# Patient Record
Sex: Female | Born: 1995 | Race: White | Hispanic: Yes | Marital: Single | Smoking: Never smoker
Health system: Southern US, Community
[De-identification: ages and names within clinical notes are randomized; demographics above are authoritative.]

## PROBLEM LIST (undated history)

## (undated) DIAGNOSIS — N6320 Unspecified lump in the left breast, unspecified quadrant: Secondary | ICD-10-CM

## (undated) HISTORY — PX: WISDOM TOOTH EXTRACTION: SHX21

---
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
## 2014-03-31 ENCOUNTER — Emergency Department (HOSPITAL_COMMUNITY): Payer: Medicaid Other

## 2014-03-31 ENCOUNTER — Emergency Department (HOSPITAL_COMMUNITY)
Admission: EM | Admit: 2014-03-31 | Discharge: 2014-03-31 | Disposition: A | Discharge status: Home / Self Care | Payer: Self-pay | Attending: Emergency Medicine | Admitting: Emergency Medicine

## 2014-03-31 ENCOUNTER — Encounter (HOSPITAL_COMMUNITY): Payer: Self-pay | Admitting: Emergency Medicine

## 2014-03-31 DIAGNOSIS — Z79899 Other long term (current) drug therapy: Secondary | ICD-10-CM | POA: Insufficient documentation

## 2014-03-31 DIAGNOSIS — Z792 Long term (current) use of antibiotics: Secondary | ICD-10-CM | POA: Insufficient documentation

## 2014-03-31 DIAGNOSIS — E860 Dehydration: Secondary | ICD-10-CM

## 2014-03-31 DIAGNOSIS — R109 Unspecified abdominal pain: Secondary | ICD-10-CM

## 2014-03-31 DIAGNOSIS — J45909 Unspecified asthma, uncomplicated: Secondary | ICD-10-CM | POA: Insufficient documentation

## 2014-03-31 DIAGNOSIS — Z3202 Encounter for pregnancy test, result negative: Secondary | ICD-10-CM | POA: Insufficient documentation

## 2014-03-31 DIAGNOSIS — M549 Dorsalgia, unspecified: Secondary | ICD-10-CM | POA: Insufficient documentation

## 2014-03-31 DIAGNOSIS — R112 Nausea with vomiting, unspecified: Secondary | ICD-10-CM

## 2014-03-31 LAB — CBC WITH DIFFERENTIAL/PLATELET
BASOS PCT: 0 % (ref 0–1)
Basophils Absolute: 0 10*3/uL (ref 0.0–0.1)
Eosinophils Absolute: 0 10*3/uL (ref 0.0–0.7)
Eosinophils Relative: 0 % (ref 0–5)
HEMATOCRIT: 36.1 % (ref 36.0–46.0)
HEMOGLOBIN: 11.8 g/dL — AB (ref 12.0–15.0)
LYMPHS ABS: 1.7 10*3/uL (ref 0.7–4.0)
Lymphocytes Relative: 19 % (ref 12–46)
MCH: 25.1 pg — AB (ref 26.0–34.0)
MCHC: 32.7 g/dL (ref 30.0–36.0)
MCV: 76.6 fL — ABNORMAL LOW (ref 78.0–100.0)
MONO ABS: 1.1 10*3/uL — AB (ref 0.1–1.0)
MONOS PCT: 13 % — AB (ref 3–12)
NEUTROS ABS: 6 10*3/uL (ref 1.7–7.7)
Neutrophils Relative %: 68 % (ref 43–77)
Platelets: 283 10*3/uL (ref 150–400)
RBC: 4.71 MIL/uL (ref 3.87–5.11)
RDW: 14.2 % (ref 11.5–15.5)
WBC: 8.9 10*3/uL (ref 4.0–10.5)

## 2014-03-31 LAB — COMPREHENSIVE METABOLIC PANEL
ALBUMIN: 3.9 g/dL (ref 3.5–5.2)
ALT: 7 U/L (ref 0–35)
ANION GAP: 13 (ref 5–15)
AST: 18 U/L (ref 0–37)
Alkaline Phosphatase: 48 U/L (ref 39–117)
BILIRUBIN TOTAL: 0.6 mg/dL (ref 0.3–1.2)
BUN: 12 mg/dL (ref 6–23)
CHLORIDE: 104 meq/L (ref 96–112)
CO2: 25 mEq/L (ref 19–32)
CREATININE: 1.36 mg/dL — AB (ref 0.50–1.10)
Calcium: 9.2 mg/dL (ref 8.4–10.5)
GFR, EST AFRICAN AMERICAN: 65 mL/min — AB (ref >90)
GFR, EST NON AFRICAN AMERICAN: 56 mL/min — AB (ref >90)
GLUCOSE: 90 mg/dL (ref 70–99)
Potassium: 3.5 mEq/L — ABNORMAL LOW (ref 3.7–5.3)
Sodium: 142 mEq/L (ref 137–147)
Total Protein: 7.2 g/dL (ref 6.0–8.3)

## 2014-03-31 LAB — LIPASE, BLOOD: LIPASE: 32 U/L (ref 11–59)

## 2014-03-31 LAB — URINALYSIS, ROUTINE W REFLEX MICROSCOPIC
BILIRUBIN URINE: NEGATIVE
GLUCOSE, UA: NEGATIVE mg/dL
KETONES UR: NEGATIVE mg/dL
Leukocytes, UA: NEGATIVE
Nitrite: NEGATIVE
PROTEIN: 100 mg/dL — AB
Specific Gravity, Urine: 1.008 (ref 1.005–1.030)
UROBILINOGEN UA: 0.2 mg/dL (ref 0.0–1.0)
pH: 6.5 (ref 5.0–8.0)

## 2014-03-31 LAB — POC URINE PREG, ED: Preg Test, Ur: NEGATIVE

## 2014-03-31 LAB — URINE MICROSCOPIC-ADD ON

## 2014-03-31 MED ORDER — ONDANSETRON HCL 4 MG/2ML IJ SOLN: 4.0000 mg | Freq: Once | INTRAMUSCULAR | Status: DC

## 2014-03-31 MED ORDER — HYDROCODONE-ACETAMINOPHEN 5-325 MG PO TABS: 2.0000 | ORAL_TABLET | ORAL | Status: DC | PRN

## 2014-03-31 MED ORDER — ONDANSETRON HCL 4 MG PO TABS: 4.0000 mg | ORAL_TABLET | Freq: Three times a day (TID) | ORAL | Status: DC | PRN

## 2014-03-31 MED ORDER — OXYCODONE-ACETAMINOPHEN 5-325 MG PO TABS: 1.0000 | ORAL_TABLET | Freq: Once | ORAL | Status: DC

## 2014-03-31 MED ORDER — HYDROCODONE-ACETAMINOPHEN 5-325 MG PO TABS
1.0000 | ORAL_TABLET | Freq: Once | ORAL | Status: AC
  Administered 2014-03-31: 1 via ORAL
  Filled 2014-03-31: qty 1

## 2014-03-31 MED ORDER — SODIUM CHLORIDE 0.9 % IV BOLUS (SEPSIS)
1000.0000 mL | Freq: Once | INTRAVENOUS | Status: AC
  Administered 2014-03-31: 1000 mL via INTRAVENOUS

## 2014-03-31 MED ORDER — ONDANSETRON HCL 4 MG/2ML IJ SOLN
4.0000 mg | Freq: Once | INTRAMUSCULAR | Status: AC
  Administered 2014-03-31: 4 mg via INTRAVENOUS
  Filled 2014-03-31: qty 2

## 2014-03-31 MED ORDER — MORPHINE SULFATE 4 MG/ML IJ SOLN
4.0000 mg | Freq: Once | INTRAMUSCULAR | Status: AC
  Administered 2014-03-31: 4 mg via INTRAVENOUS
  Filled 2014-03-31: qty 1

## 2014-03-31 NOTE — ED Notes (Signed)
Pt c/o back pain, lower abd pain and vomiting that started yesterday. Pt states constipation last BM was two days ago.

## 2014-03-31 NOTE — ED Provider Notes (Signed)
CSN: [id number]     Arrival date & time 03/31/14  1331 History   First MD Initiated Contact with Patient 03/31/14 1410     Chief Complaint  Patient presents with  . Back Pain  . Abdominal Pain    lower  . Emesis     (Consider location/radiation/quality/duration/timing/severity/associated sxs/prior Treatment) HPI  This is a 18 year old female with a history of asthma who presents with neck pain, abdominal pain, and vomiting. Onset of symptoms yesterday. Patient reports left-sided back and flank pain that radiates into her left lower quadrant. She states that it feels "sore." Currently her pain is 7 out of 10. She reports nonbilious, nonbloody emesis. She denies any diarrhea. Last normal bowel movement was 2 days ago. Patient denies any dysuria or hematuria. No known history of kidney stones. Denies fevers. Patient has had sick contacts.  Past Medical History  Diagnosis Date  . Asthma    History reviewed. No pertinent past surgical history. No family history on file. History  Substance Use Topics  . Smoking status: Never Smoker   . Smokeless tobacco: Not on file  . Alcohol Use: No   OB History    No data available     Review of Systems  Constitutional: Negative for fever.  Respiratory: Negative for chest tightness and shortness of breath.   Cardiovascular: Negative for chest pain.  Gastrointestinal: Positive for nausea, vomiting and abdominal pain. Negative for diarrhea.  Genitourinary: Positive for flank pain. Negative for dysuria and hematuria.  Musculoskeletal: Positive for back pain.  Neurological: Negative for headaches.  All other systems reviewed and are negative.     Allergies  Review of patient's allergies indicates no known allergies.  Home Medications   Prior to Admission medications   Medication Sig Start Date End Date Taking? Authorizing Provider  Multiple Vitamins-Minerals (MULTIVITAMIN & MINERAL PO) Take 1 tablet by mouth daily.   Yes Historical  Provider, MD  amoxicillin (AMOXIL) 500 MG capsule Take 500 mg by mouth 3 (three) times daily. Started on 12/23/13 for ten days    Historical Provider, MD  azithromycin (ZITHROMAX Z-PAK) 250 MG tablet 2 po day one, then 1 daily x 4 days 12/24/13   Carman Ching, PA-C  etonogestrel (NEXPLANON) 68 MG IMPL implant 1 each by Subdermal route once.    Historical Provider, MD  HYDROcodone-acetaminophen (NORCO/VICODIN) 5-325 MG per tablet Take 2 tablets by mouth every 4 (four) hours as needed for moderate pain or severe pain. 03/31/14   Merryl Hacker, MD  ibuprofen (ADVIL,MOTRIN) 200 MG tablet Take 600 mg by mouth every 6 (six) hours as needed for moderate pain.    Historical Provider, MD  ondansetron (ZOFRAN ODT) 4 MG disintegrating tablet 4mg  ODT q4 hours prn nausea/vomit 12/24/13   Robyn M Hess, PA-C  ondansetron (ZOFRAN) 4 MG tablet Take 1 tablet (4 mg total) by mouth every 8 (eight) hours as needed for nausea or vomiting. 03/31/14   Merryl Hacker, MD   BP 107/74 mmHg  Pulse 65  Temp(Src) 97.8 F (36.6 C) (Oral)  Resp 16  SpO2 100%  LMP 03/13/2014 Physical Exam  Constitutional: She is oriented to person, place, and time. She appears well-developed and well-nourished. No distress.  HENT:  Head: Normocephalic and atraumatic.  Cardiovascular: Normal rate, regular rhythm and normal heart sounds.   No murmur heard. Pulmonary/Chest: Effort normal and breath sounds normal. No respiratory distress. She has no wheezes.  Abdominal: Soft. Bowel sounds are normal. There is no tenderness. There  is no rebound and no guarding.  Musculoskeletal: She exhibits no edema.  Neurological: She is alert and oriented to person, place, and time.  Skin: Skin is warm and dry.  Psychiatric: She has a normal mood and affect.  Nursing note and vitals reviewed.   ED Course  Procedures (including critical care time) Labs Review Labs Reviewed  CBC WITH DIFFERENTIAL - Abnormal; Notable for the following:    Hemoglobin  11.8 (*)    MCV 76.6 (*)    MCH 25.1 (*)    Monocytes Relative 13 (*)    Monocytes Absolute 1.1 (*)    All other components within normal limits  COMPREHENSIVE METABOLIC PANEL - Abnormal; Notable for the following:    Potassium 3.5 (*)    Creatinine, Ser 1.36 (*)    GFR calc non Af Amer 56 (*)    GFR calc Af Amer 65 (*)    All other components within normal limits  URINALYSIS, ROUTINE W REFLEX MICROSCOPIC - Abnormal; Notable for the following:    Hgb urine dipstick TRACE (*)    Protein, ur 100 (*)    All other components within normal limits  LIPASE, BLOOD  URINE MICROSCOPIC-ADD ON  POC URINE PREG, ED    Imaging Review Ct Renal Stone Study  03/31/2014   CLINICAL DATA:  Bilateral flank pain began yesterday. Left side is worse than right.  EXAM: CT ABDOMEN AND PELVIS WITHOUT CONTRAST  TECHNIQUE: Multidetector CT imaging of the abdomen and pelvis was performed following the standard protocol without IV contrast.  COMPARISON:  None.  FINDINGS: Lower chest: The lung bases are clear of acute process. No pleural effusion or pulmonary lesions. The heart is normal in size. No pericardial effusion. The distal esophagus and aorta are unremarkable.  Hepatobiliary: Normal.  Pancreas: Normal.  Spleen: Normal.  Adrenals/Urinary Tract: The adrenal glands are normal. Both kidneys are unremarkable. No renal or obstructing ureteral calculi or bladder calculi.  Stomach/Bowel: The stomach, duodenum, small bowel and colon are grossly normal without oral contrast. No obvious inflammatory changes, mass lesions or obstructive findings. The terminal ileum appears normal. The appendix is normal.  Vascular/Lymphatic: No mesenteric or retroperitoneal mass or adenopathy. The aorta is normal in caliber.  Reproductive: The uterus and ovaries are unremarkable. Mild retroverted uterus. The bladder is normal. No pelvic mass, adenopathy or free pelvic fluid collections. No inguinal mass or adenopathy.  Other: No free abdominal/  pelvic fluid collections or free air. No abdominal wall hernia.  Musculoskeletal: No significant findings.  IMPRESSION: Unremarkable noncontrast CT examination of the abdomen/pelvis. No renal or obstructing ureteral calculi.   Electronically Signed   By: Kalman Jewels M.D.   On: 03/31/2014 15:51     EKG Interpretation None      MDM   Final diagnoses:  Left flank pain  Non-intractable vomiting with nausea, vomiting of unspecified type  Dehydration   Left-sided back and flank pain and vomiting. She is nontoxic on exam. Vital signs are stable. No significant abdominal tenderness on exam. Basic labwork obtained and patient given medications. Lab work notable for creatinine of 1.36. Likely reflective of dehydration. Patient's presentation could be consistent with kidney stones. CT noncontrast is obtained and shows no evidence of renal calculi. And is otherwise unremarkable.  Patient does report sick contacts. While she has no diarrhea, vomiting could be secondary to early GI illness.  Discussed with patient and her father supportive care at home. She has worsening of symptoms, she should be reevaluated.  After  history, exam, and medical workup I feel the patient has been appropriately medically screened and is safe for discharge home. Pertinent diagnoses were discussed with the patient. Patient was given return precautions.      Merryl Hacker, MD 04/01/14 [phone]

## 2014-03-31 NOTE — Discharge Instructions (Signed)
You were seen today for back pain, abdominal pain and vomiting. Workup was largely reassuring. You do appear somewhat dehydrated. He will be given nausea medication. Your symptoms may be related to early viral gastroenteritis.  If you have any new or worsening symptoms, you should be reevaluated.  Flank Pain Flank pain refers to pain that is located on the side of the body between the upper abdomen and the back. The pain may occur over a short period of time (acute) or may be long-term or reoccurring (chronic). It may be mild or severe. Flank pain can be caused by many things. CAUSES  Some of the more common causes of flank pain include:  Muscle strains.   Muscle spasms.   A disease of your spine (vertebral disk disease).   A lung infection (pneumonia).   Fluid around your lungs (pulmonary edema).   A kidney infection.   Kidney stones.   A very painful skin rash caused by the chickenpox virus (shingles).   Gallbladder disease.  [place] care will depend on the cause of your pain. In general,  Rest as directed by your caregiver.  Drink enough fluids to keep your urine clear or pale yellow.  Only take over-the-counter or prescription medicines as directed by your caregiver. Some medicines may help relieve the pain.  Tell your caregiver about any changes in your pain.  Follow up with your caregiver as directed. SEEK IMMEDIATE MEDICAL CARE IF:   Your pain is not controlled with medicine.   You have new or worsening symptoms.  Your pain increases.   You have abdominal pain.   You have shortness of breath.   You have persistent nausea or vomiting.   You have swelling in your abdomen.   You feel faint or pass out.   You have blood in your urine.  You have a fever or persistent symptoms for more than 2-3 days.  You have a fever and your symptoms suddenly get worse. MAKE SURE YOU:   Understand these instructions.  Will watch  your condition.  Will get help right away if you are not doing well or get worse. Document Released: 07/07/2005 Document Revised: 02/08/2012 Document Reviewed: 12/29/2011 [facility] Patient Information 2015 [place], [place]. This information is not intended to replace advice given to you by your health care provider. Make sure you discuss any questions you have with your health care provider.

## 2014-04-02 ENCOUNTER — Emergency Department (HOSPITAL_COMMUNITY): Payer: Medicaid Other

## 2014-04-02 ENCOUNTER — Emergency Department (HOSPITAL_COMMUNITY)
Admission: EM | Admit: 2014-04-02 | Discharge: 2014-04-02 | Disposition: A | Discharge status: Home / Self Care | Payer: Medicaid Other | Attending: Emergency Medicine | Admitting: Emergency Medicine

## 2014-04-02 ENCOUNTER — Encounter (HOSPITAL_COMMUNITY): Payer: Self-pay

## 2014-04-02 DIAGNOSIS — R112 Nausea with vomiting, unspecified: Secondary | ICD-10-CM | POA: Insufficient documentation

## 2014-04-02 DIAGNOSIS — Z3202 Encounter for pregnancy test, result negative: Secondary | ICD-10-CM | POA: Insufficient documentation

## 2014-04-02 DIAGNOSIS — J45909 Unspecified asthma, uncomplicated: Secondary | ICD-10-CM | POA: Insufficient documentation

## 2014-04-02 DIAGNOSIS — M549 Dorsalgia, unspecified: Secondary | ICD-10-CM | POA: Insufficient documentation

## 2014-04-02 DIAGNOSIS — Z79899 Other long term (current) drug therapy: Secondary | ICD-10-CM | POA: Insufficient documentation

## 2014-04-02 DIAGNOSIS — Z792 Long term (current) use of antibiotics: Secondary | ICD-10-CM | POA: Insufficient documentation

## 2014-04-02 DIAGNOSIS — K5900 Constipation, unspecified: Secondary | ICD-10-CM | POA: Insufficient documentation

## 2014-04-02 DIAGNOSIS — R110 Nausea: Secondary | ICD-10-CM

## 2014-04-02 DIAGNOSIS — R109 Unspecified abdominal pain: Secondary | ICD-10-CM | POA: Insufficient documentation

## 2014-04-02 LAB — CBC WITH DIFFERENTIAL/PLATELET
BASOS PCT: 0 % (ref 0–1)
Basophils Absolute: 0 10*3/uL (ref 0.0–0.1)
EOS ABS: 0.1 10*3/uL (ref 0.0–0.7)
Eosinophils Relative: 1 % (ref 0–5)
HCT: 35.4 % — ABNORMAL LOW (ref 36.0–46.0)
HEMOGLOBIN: 11.4 g/dL — AB (ref 12.0–15.0)
Lymphocytes Relative: 16 % (ref 12–46)
Lymphs Abs: 1.5 10*3/uL (ref 0.7–4.0)
MCH: 24.8 pg — AB (ref 26.0–34.0)
MCHC: 32.2 g/dL (ref 30.0–36.0)
MCV: 77 fL — AB (ref 78.0–100.0)
MONOS PCT: 11 % (ref 3–12)
Monocytes Absolute: 0.9 10*3/uL (ref 0.1–1.0)
Neutro Abs: 6.4 10*3/uL (ref 1.7–7.7)
Neutrophils Relative %: 72 % (ref 43–77)
Platelets: 280 10*3/uL (ref 150–400)
RBC: 4.6 MIL/uL (ref 3.87–5.11)
RDW: 14.2 % (ref 11.5–15.5)
WBC: 8.9 10*3/uL (ref 4.0–10.5)

## 2014-04-02 LAB — URINALYSIS, ROUTINE W REFLEX MICROSCOPIC
BILIRUBIN URINE: NEGATIVE
Glucose, UA: NEGATIVE mg/dL
Ketones, ur: NEGATIVE mg/dL
Leukocytes, UA: NEGATIVE
NITRITE: NEGATIVE
PROTEIN: NEGATIVE mg/dL
SPECIFIC GRAVITY, URINE: 1.005 (ref 1.005–1.030)
Urobilinogen, UA: 0.2 mg/dL (ref 0.0–1.0)
pH: 6 (ref 5.0–8.0)

## 2014-04-02 LAB — COMPREHENSIVE METABOLIC PANEL
ALBUMIN: 3.7 g/dL (ref 3.5–5.2)
ALT: 6 U/L (ref 0–35)
ANION GAP: 12 (ref 5–15)
AST: 10 U/L (ref 0–37)
Alkaline Phosphatase: 44 U/L (ref 39–117)
BILIRUBIN TOTAL: 0.4 mg/dL (ref 0.3–1.2)
BUN: 14 mg/dL (ref 6–23)
CHLORIDE: 102 meq/L (ref 96–112)
CO2: 25 mEq/L (ref 19–32)
Calcium: 9.2 mg/dL (ref 8.4–10.5)
Creatinine, Ser: 2.76 mg/dL — ABNORMAL HIGH (ref 0.50–1.10)
GFR calc Af Amer: 28 mL/min — ABNORMAL LOW (ref >90)
GFR calc non Af Amer: 24 mL/min — ABNORMAL LOW (ref >90)
Glucose, Bld: 93 mg/dL (ref 70–99)
Potassium: 3.5 mEq/L — ABNORMAL LOW (ref 3.7–5.3)
Sodium: 139 mEq/L (ref 137–147)
TOTAL PROTEIN: 7.1 g/dL (ref 6.0–8.3)

## 2014-04-02 LAB — PREGNANCY, URINE: Preg Test, Ur: NEGATIVE

## 2014-04-02 LAB — LIPASE, BLOOD: LIPASE: 23 U/L (ref 11–59)

## 2014-04-02 LAB — URINE MICROSCOPIC-ADD ON

## 2014-04-02 MED ORDER — POLYETHYLENE GLYCOL 3350 17 GM/SCOOP PO POWD: 17.0000 g | Freq: Two times a day (BID) | ORAL | Status: DC

## 2014-04-02 MED ORDER — ONDANSETRON 4 MG PO TBDP: 4.0000 mg | ORAL_TABLET | Freq: Three times a day (TID) | ORAL | Status: DC | PRN

## 2014-04-02 MED ORDER — SODIUM CHLORIDE 0.9 % IV BOLUS (SEPSIS)
1000.0000 mL | Freq: Once | INTRAVENOUS | Status: AC
  Administered 2014-04-02: 1000 mL via INTRAVENOUS

## 2014-04-02 MED ORDER — ONDANSETRON HCL 4 MG/2ML IJ SOLN
4.0000 mg | Freq: Once | INTRAMUSCULAR | Status: AC
  Administered 2014-04-02: 4 mg via INTRAVENOUS
  Filled 2014-04-02: qty 2

## 2014-04-02 NOTE — ED Notes (Signed)
Pt with abdominal pain/emesis and back pain since Sunday.  No dx.  Treated with pain meds and sent home.  Pt is constipated.  Emesis continues.  Last bm Friday

## 2014-04-02 NOTE — Discharge Instructions (Signed)
Take zofran as needed for nausea. Take miralax twice per day for constipation. Refer to attached documents for more information. Return to the ED with worsening or concerning symptoms.

## 2014-04-02 NOTE — ED Provider Notes (Signed)
CSN: [id number]     Arrival date & time 04/02/14  1541 History   First MD Initiated Contact with Patient 04/02/14 1636     Chief Complaint  Patient presents with  . Abdominal Pain  . Emesis     (Consider location/radiation/quality/duration/timing/severity/associated sxs/prior Treatment) HPI Comments: Patient is a 18 year old female with a past medical history of asthma who presents with bilateral flank pain that started 4 days ago. The pain is located in her bilateral flanks and radiate around to her lower abdomen. The pain is described as aching and severe. The pain started gradually and progressively worsened since the onset. No alleviating/aggravating factors. The patient has tried zofran and vicodin for symptoms without relief. Patient was seen 2 days ago in the ED for the same symptoms. Patient's work up unremarkable. Associated symptoms include nausea and vomiting and constipation. Patient denies fever, headache, diarrhea, chest pain, SOB, dysuria, abnormal vaginal bleeding/discharge. Patient reports no bowel movement in 5 days.     Past Medical History  Diagnosis Date  . Asthma    History reviewed. No pertinent past surgical history. History reviewed. No pertinent family history. History  Substance Use Topics  . Smoking status: Never Smoker   . Smokeless tobacco: Not on file  . Alcohol Use: No   OB History    No data available     Review of Systems  Constitutional: Negative for fever, chills and fatigue.  HENT: Negative for trouble swallowing.   Eyes: Negative for visual disturbance.  Respiratory: Negative for shortness of breath.   Cardiovascular: Negative for chest pain and palpitations.  Gastrointestinal: Positive for nausea and vomiting. Negative for abdominal pain and diarrhea.  Genitourinary: Positive for flank pain. Negative for dysuria and difficulty urinating.  Musculoskeletal: Positive for back pain. Negative for arthralgias and neck pain.  Skin: Negative for  color change.  Neurological: Negative for dizziness and weakness.  Psychiatric/Behavioral: Negative for dysphoric mood.      Allergies  Shrimp  Home Medications   Prior to Admission medications   Medication Sig Start Date End Date Taking? Authorizing Provider  etonogestrel (NEXPLANON) 68 MG IMPL implant 1 each by Subdermal route once.   Yes Historical Provider, MD  HYDROcodone-acetaminophen (NORCO/VICODIN) 5-325 MG per tablet Take 2 tablets by mouth every 4 (four) hours as needed for moderate pain or severe pain. 03/31/14  Yes Merryl Hacker, MD  ibuprofen (ADVIL,MOTRIN) 200 MG tablet Take 600 mg by mouth every 6 (six) hours as needed for moderate pain.   Yes Historical Provider, MD  Multiple Vitamins-Minerals (MULTIVITAMIN & MINERAL PO) Take 1 tablet by mouth daily.   Yes Historical Provider, MD  ondansetron (ZOFRAN ODT) 4 MG disintegrating tablet 4mg  ODT q4 hours prn nausea/vomit 12/24/13  Yes Robyn M Hess, PA-C  amoxicillin (AMOXIL) 500 MG capsule Take 500 mg by mouth 3 (three) times daily. Started on 12/23/13 for ten days    Historical Provider, MD  azithromycin (ZITHROMAX Z-PAK) 250 MG tablet 2 po day one, then 1 daily x 4 days 12/24/13   Hessie Diener Hess, PA-C  ondansetron (ZOFRAN) 4 MG tablet Take 1 tablet (4 mg total) by mouth every 8 (eight) hours as needed for nausea or vomiting. Patient not taking: Reported on 04/02/2014 03/31/14   Merryl Hacker, MD   BP 138/86 mmHg  Pulse 67  Temp(Src) 98 F (36.7 C) (Oral)  Resp 14  SpO2 100%  LMP 03/13/2014 Physical Exam  Constitutional: She is oriented to person, place, and time.  She appears well-developed and well-nourished. No distress.  HENT:  Head: Normocephalic and atraumatic.  Eyes: Conjunctivae and EOM are normal.  Neck: Normal range of motion.  Cardiovascular: Normal rate and regular rhythm.  Exam reveals no gallop and no friction rub.   No murmur heard. Pulmonary/Chest: Effort normal and breath sounds normal. She has no  wheezes. She has no rales. She exhibits no tenderness.  Abdominal: Soft. She exhibits no distension. There is no tenderness. There is no rebound.  Genitourinary:  No CVA tenderness.   Musculoskeletal: Normal range of motion.  Neurological: She is alert and oriented to person, place, and time. Coordination normal.  Speech is goal-oriented. Moves limbs without ataxia.   Skin: Skin is warm and dry.  Psychiatric: She has a normal mood and affect. Her behavior is normal.  Nursing note and vitals reviewed.   ED Course  Procedures (including critical care time) Labs Review Labs Reviewed  CBC WITH DIFFERENTIAL - Abnormal; Notable for the following:    Hemoglobin 11.4 (*)    HCT 35.4 (*)    MCV 77.0 (*)    MCH 24.8 (*)    All other components within normal limits  COMPREHENSIVE METABOLIC PANEL - Abnormal; Notable for the following:    Potassium 3.5 (*)    Creatinine, Ser 2.76 (*)    GFR calc non Af Amer 24 (*)    GFR calc Af Amer 28 (*)    All other components within normal limits  URINALYSIS, ROUTINE W REFLEX MICROSCOPIC - Abnormal; Notable for the following:    Hgb urine dipstick TRACE (*)    All other components within normal limits  LIPASE, BLOOD  PREGNANCY, URINE  URINE MICROSCOPIC-ADD ON    Imaging Review Dg Abd Acute W/chest  04/02/2014   CLINICAL DATA:  Abdominal pain and vomiting for 3 days. The patient was seen in the emergency department 2 days ago. No improvement.  EXAM: ACUTE ABDOMEN SERIES (ABDOMEN 2 VIEW & CHEST 1 VIEW)  COMPARISON:  CT of the abdomen and pelvis on 03/31/2014  FINDINGS: Heart size is normal. Lungs are clear. There is no free intraperitoneal air. Bowel gas pattern is nonobstructed. No evidence for organomegaly. Visualized osseous structures have a normal appearance.  IMPRESSION: Negative abdominal radiographs.  No acute cardiopulmonary disease.   Electronically Signed   By: Shon Hale M.D.   On: 04/02/2014 18:06     EKG Interpretation None      MDM    Final diagnoses:  Abdominal pain  Flank pain  Nausea    5:04 PM Labs and urinalysis pending. Patient will have acute abdominal series for constipation.   Patient's labs and urinalysis unremarkable for acute changes. Patient will be discharged with zofran for nausea and miralax for constipation. No further evaluation needed at this time.    Alvina Chou, PA-C 04/03/14 0038  Evelina Bucy, MD 04/05/14 2325

## 2015-04-30 IMAGING — CT CT RENAL STONE PROTOCOL
1 series · 13 of 15 positions shown, 19 images · non-contrast
Comparison: None.

CLINICAL DATA: Bilateral flank pain began yesterday. Left side is
worse than right.

EXAM:
CT ABDOMEN AND PELVIS WITHOUT CONTRAST
TECHNIQUE: Multidetector CT imaging of the abdomen and pelvis was performed
following the standard protocol without IV contrast.

[Series 4: lung · axial · 0.61mm/px · z∈[-71,-11]mm · 13 of 15 slices shown, 19 images]
[im 2/15  soft-tissue]
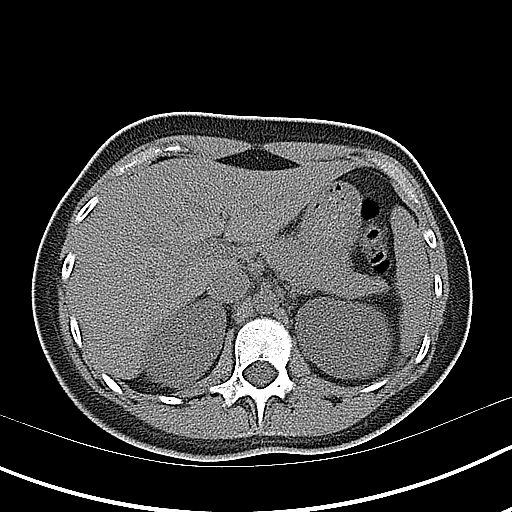
[im 2/15  bone]
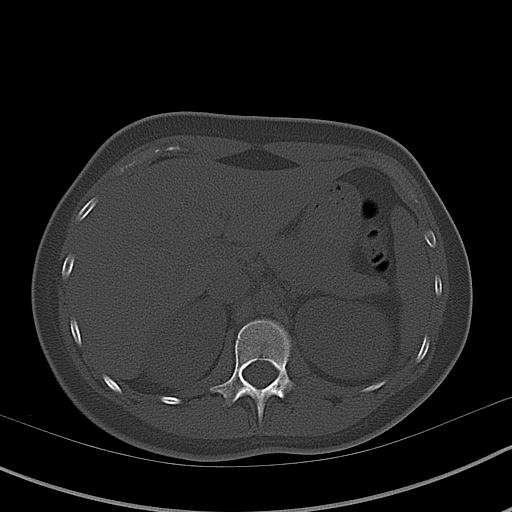
[im 3/15  soft-tissue]
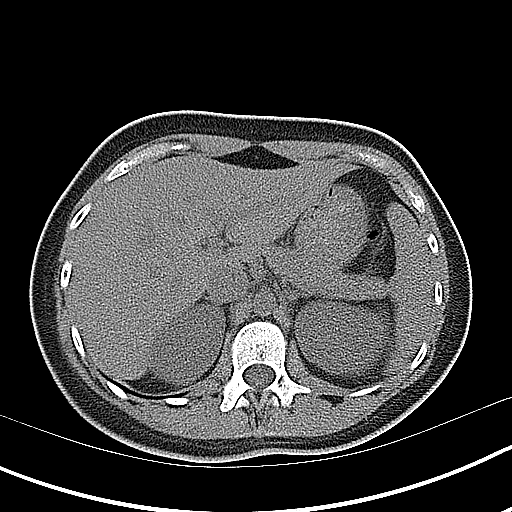
[im 4/15  soft-tissue]
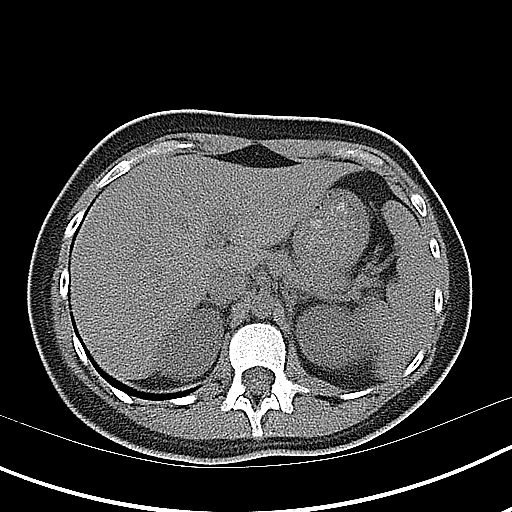
[im 5/15  soft-tissue]
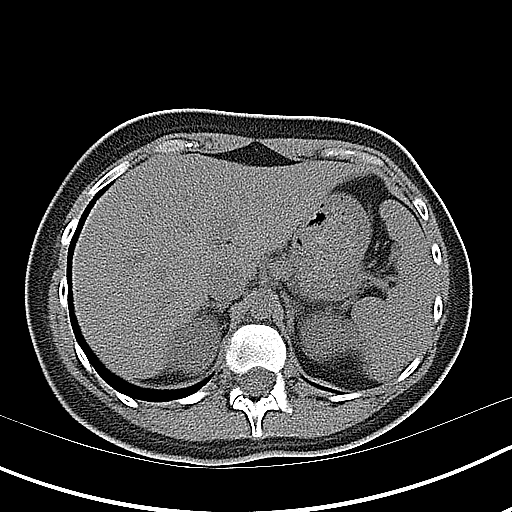
[im 6/15  soft-tissue]
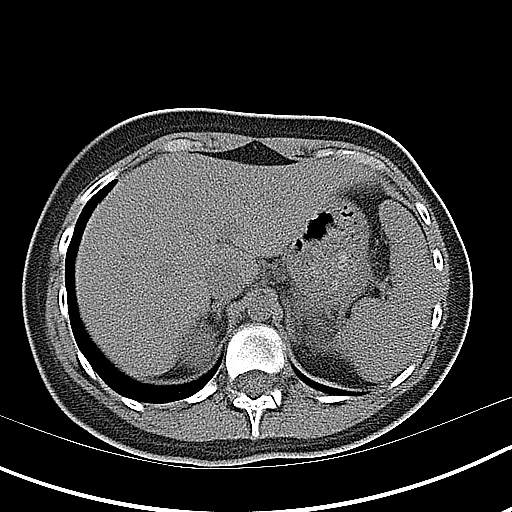
[im 7/15  soft-tissue]
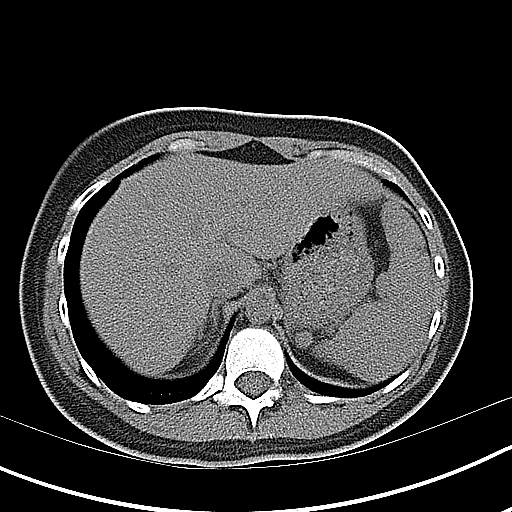
[im 8/15  soft-tissue]
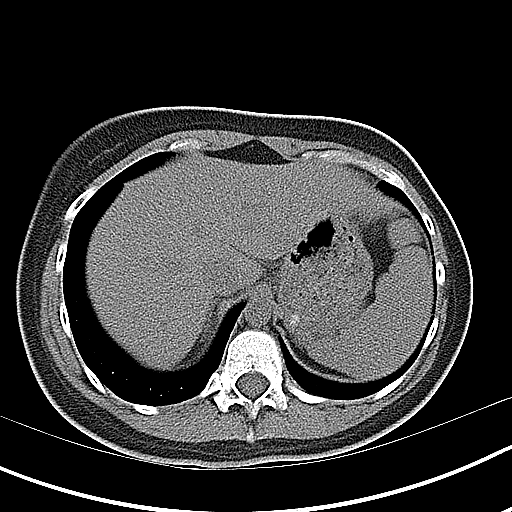
[im 9/15  soft-tissue]
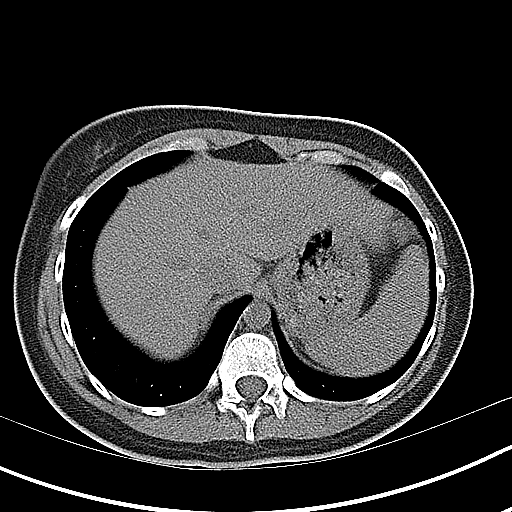
[im 10/15  soft-tissue]
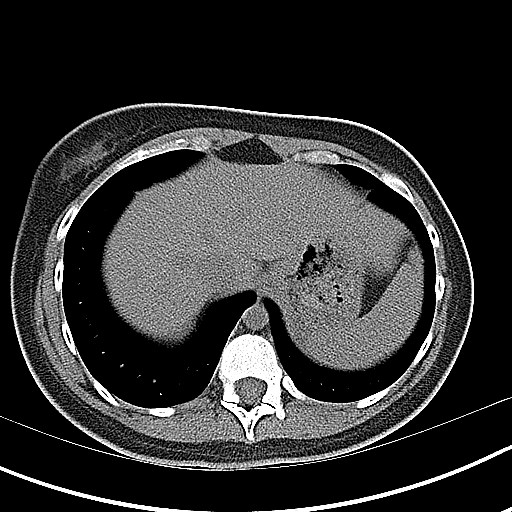
[im 10/15  bone]
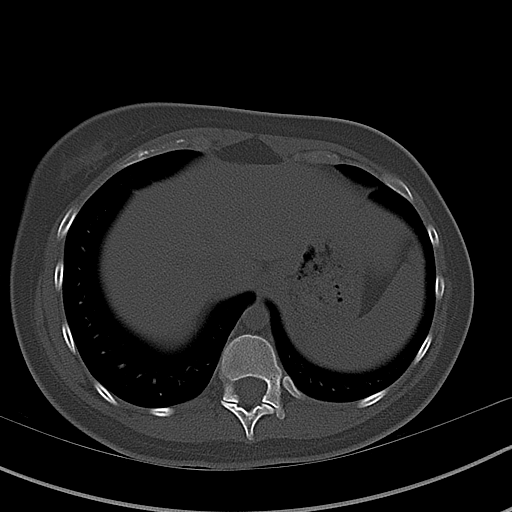
[im 11/15  soft-tissue]
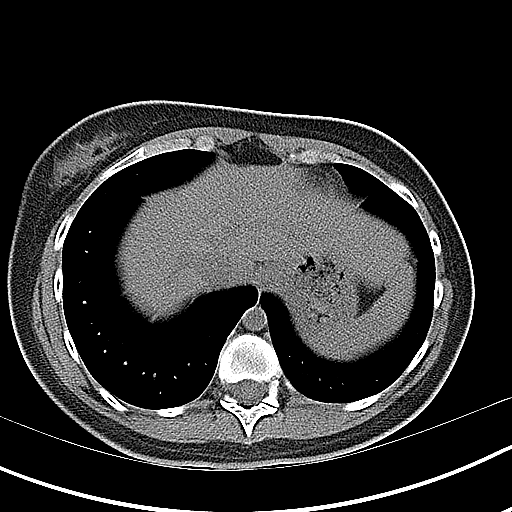
[im 11/15  lung]
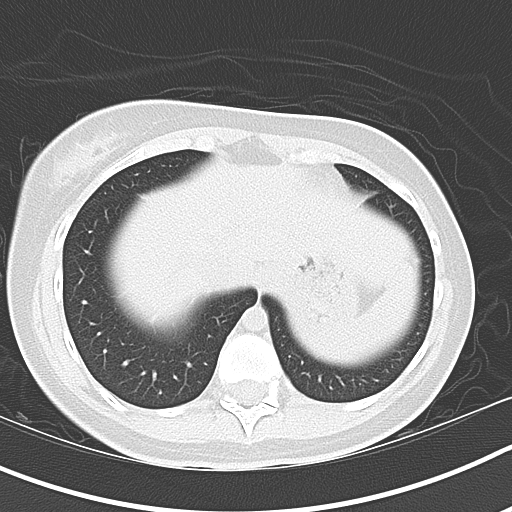
[im 12/15  soft-tissue]
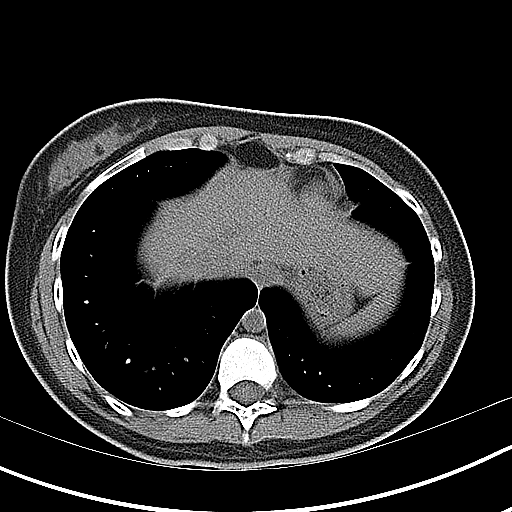
[im 12/15  lung]
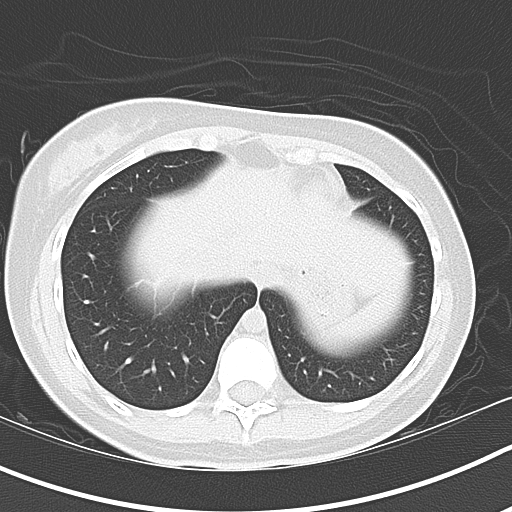
[im 13/15  soft-tissue]
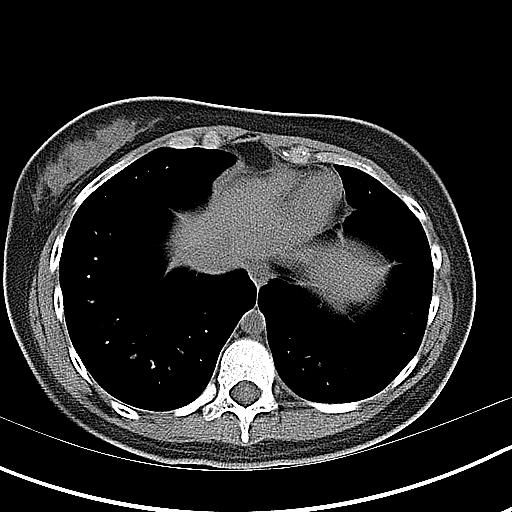
[im 13/15  lung]
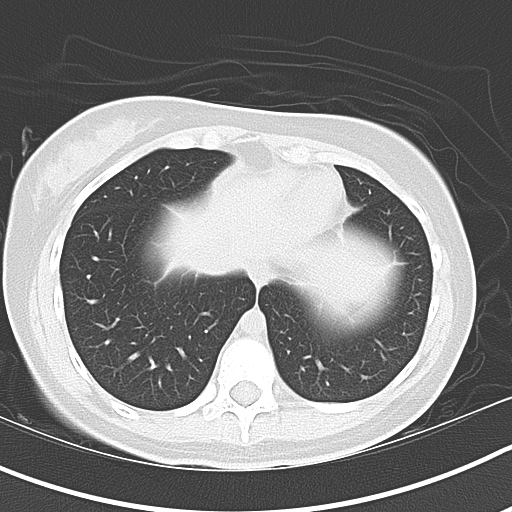
[im 14/15  soft-tissue]
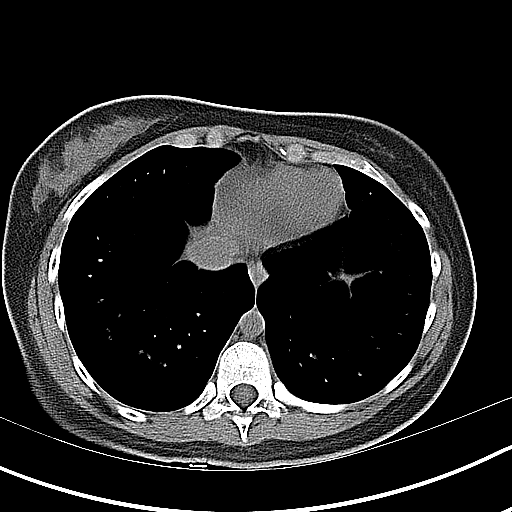
[im 14/15  lung]
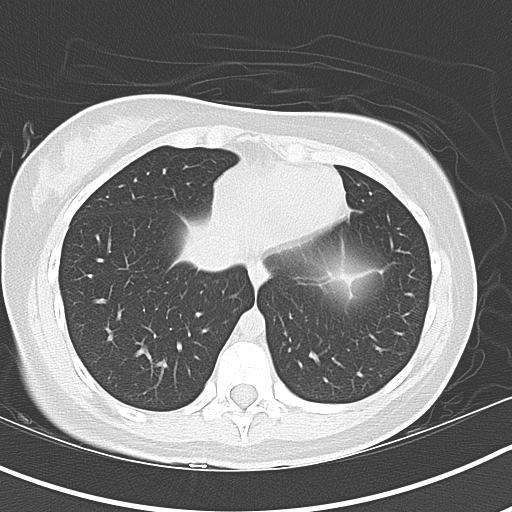

[13 of 15 positions shown; findings below may reference images not displayed]

FINDINGS: Lower chest: The lung bases are clear of acute process. No pleural
effusion or pulmonary lesions. The heart is normal in size. No
pericardial effusion. The distal esophagus and aorta are
unremarkable.

Hepatobiliary: Normal.

Pancreas: Normal.

Spleen: Normal.

Adrenals/Urinary Tract: The adrenal glands are normal. Both kidneys
are unremarkable. No renal or obstructing ureteral calculi or
bladder calculi.

Stomach/Bowel: The stomach, duodenum, small bowel and colon are
grossly normal without oral contrast. No obvious inflammatory
changes, mass lesions or obstructive findings. The terminal ileum
appears normal. The appendix is normal.

Vascular/Lymphatic: No mesenteric or retroperitoneal mass or
adenopathy. The aorta is normal in caliber.

Reproductive: The uterus and ovaries are unremarkable. Mild
retroverted uterus. The bladder is normal. No pelvic mass,
adenopathy or free pelvic fluid collections. No inguinal mass or
adenopathy.

Other: No free abdominal/ pelvic fluid collections or free air. No
abdominal wall hernia.

Musculoskeletal: No significant findings.
IMPRESSION: Unremarkable noncontrast CT examination of the abdomen/pelvis. No
renal or obstructing ureteral calculi.

## 2015-05-02 IMAGING — CR DG ABDOMEN ACUTE W/ 1V CHEST
3 series · 3 of 3 positions shown · non-contrast
Comparison: CT of the abdomen and pelvis on 03/31/2014

CLINICAL DATA: Abdominal pain and vomiting for 3 days. The patient
was seen in the emergency department 2 days ago. No improvement.

EXAM:
ACUTE ABDOMEN SERIES (ABDOMEN 2 VIEW & CHEST 1 VIEW)

[w chest pa]
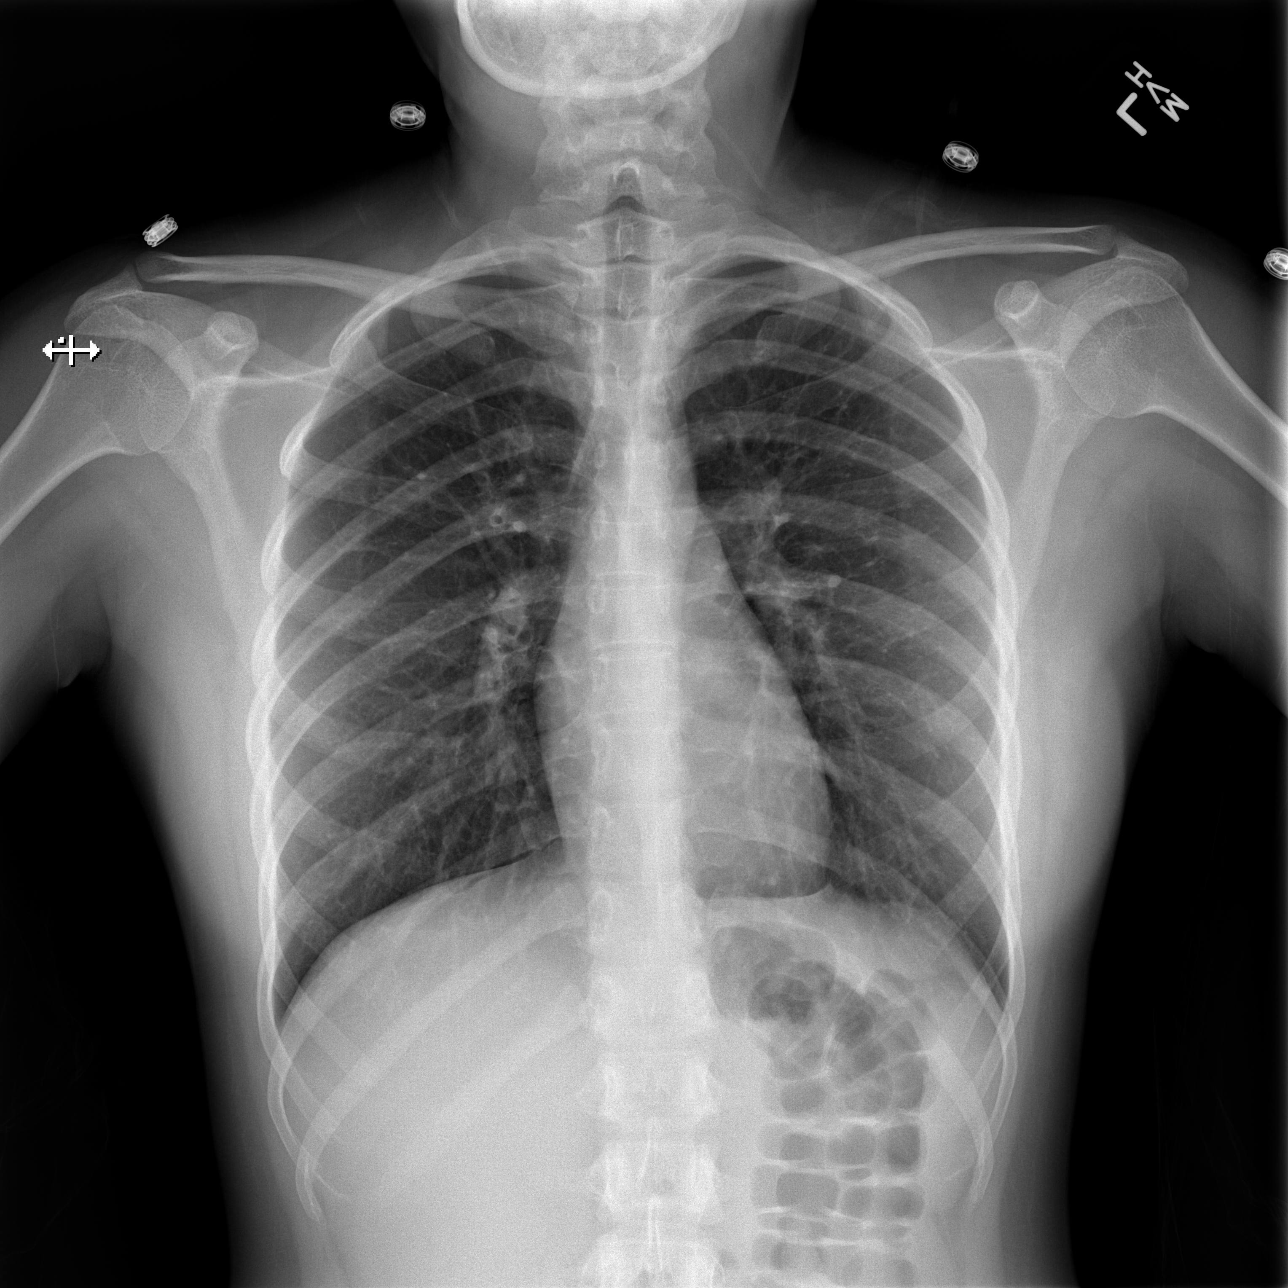

[w abdomen upright]
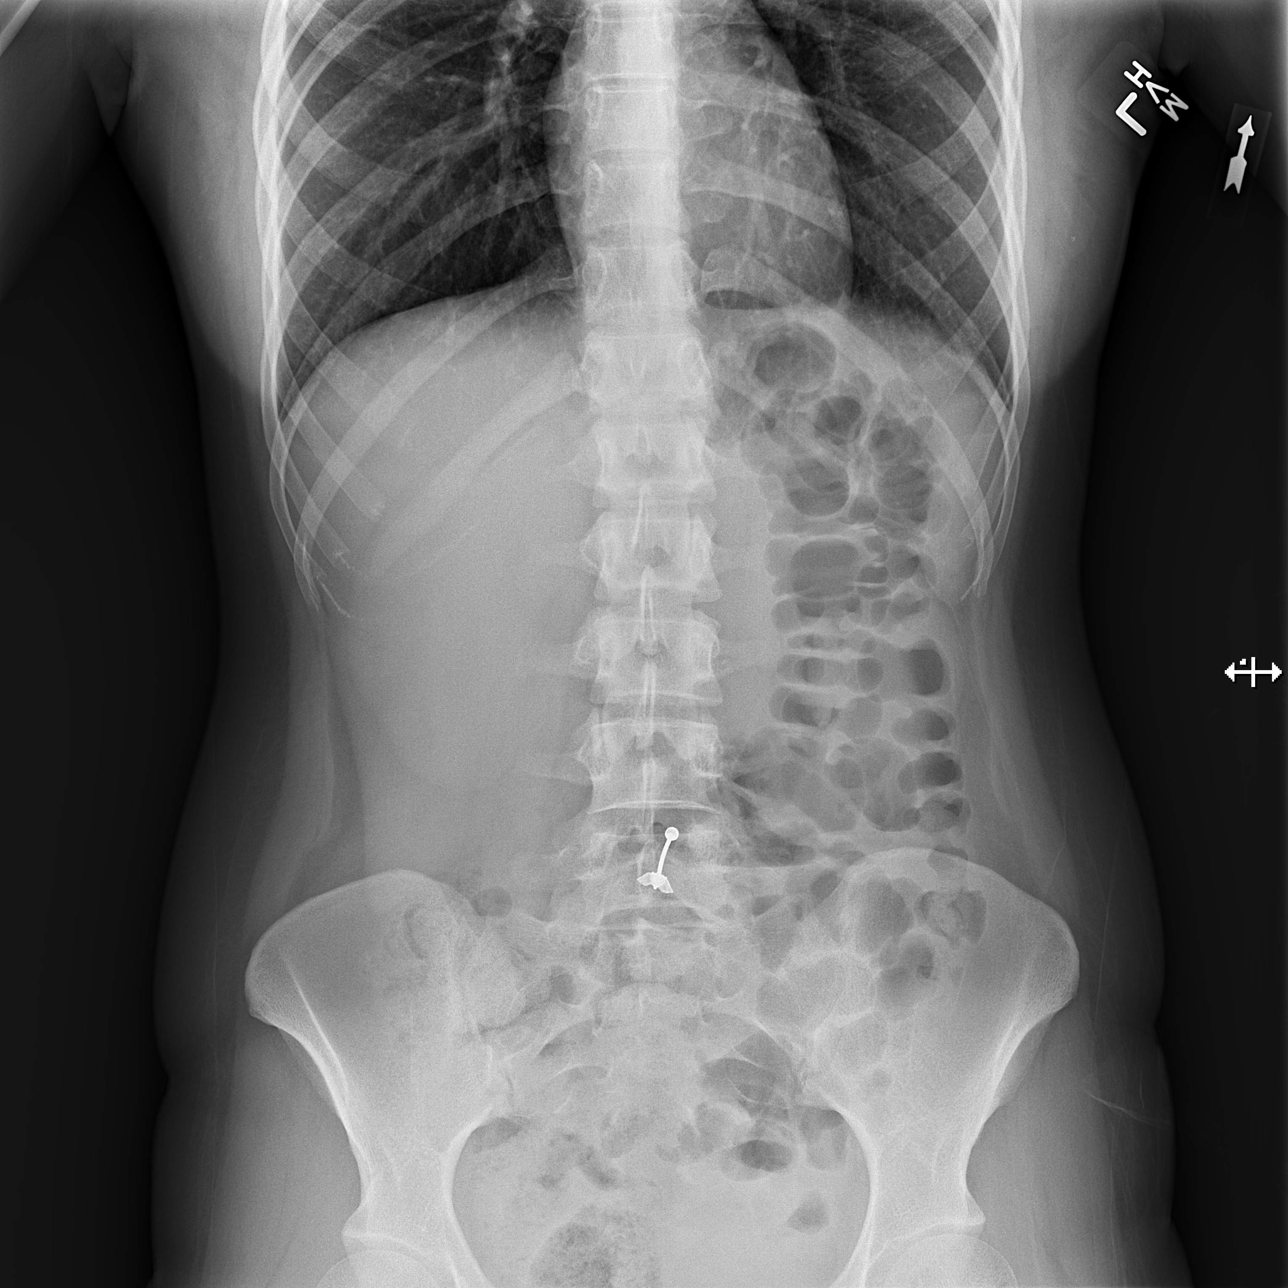

[t abdomen supine]
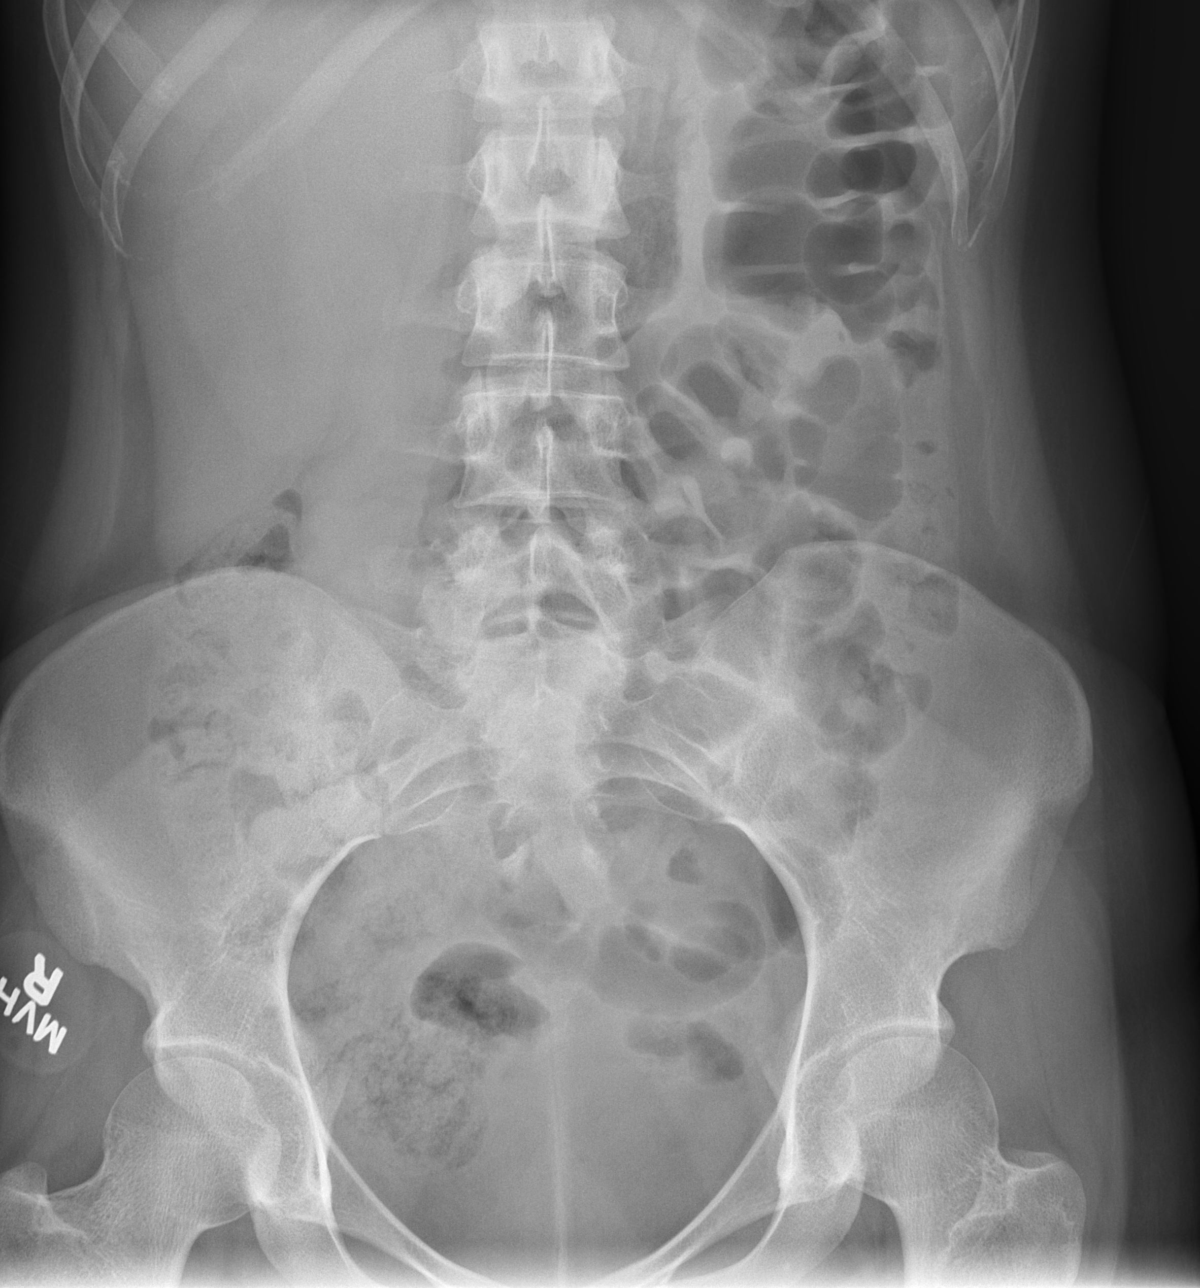

[3 of 3 positions shown; findings below may reference images not displayed]

FINDINGS: Heart size is normal. Lungs are clear. There is no free
intraperitoneal air. Bowel gas pattern is nonobstructed. No evidence
for organomegaly. Visualized osseous structures have a normal
appearance.
IMPRESSION: Negative abdominal radiographs.  No acute cardiopulmonary disease.

## 2016-01-13 ENCOUNTER — Other Ambulatory Visit: Payer: Self-pay | Admitting: Nurse Practitioner

## 2016-01-13 DIAGNOSIS — N63 Unspecified lump in unspecified breast: Secondary | ICD-10-CM

## 2016-01-15 ENCOUNTER — Other Ambulatory Visit: Payer: Self-pay | Admitting: Nurse Practitioner

## 2016-01-15 ENCOUNTER — Encounter (HOSPITAL_COMMUNITY): Payer: Self-pay

## 2016-01-15 ENCOUNTER — Ambulatory Visit (HOSPITAL_COMMUNITY)
Admission: RE | Admit: 2016-01-15 | Discharge: 2016-01-15 | Disposition: A | Discharge status: Home / Self Care | Payer: Self-pay | Source: Ambulatory Visit | Attending: Obstetrics and Gynecology | Admitting: Obstetrics and Gynecology

## 2016-01-15 ENCOUNTER — Ambulatory Visit
Admission: RE | Admit: 2016-01-15 | Discharge: 2016-01-15 | Disposition: A | Discharge status: Home / Self Care | Payer: No Typology Code available for payment source | Source: Ambulatory Visit | Attending: Nurse Practitioner | Admitting: Nurse Practitioner

## 2016-01-15 VITALS — BP 110/64 | Temp 98.4°F | Ht 62.0 in | Wt 132.0 lb

## 2016-01-15 DIAGNOSIS — N6320 Unspecified lump in the left breast, unspecified quadrant: Secondary | ICD-10-CM

## 2016-01-15 DIAGNOSIS — N63 Unspecified lump in unspecified breast: Secondary | ICD-10-CM

## 2016-01-15 DIAGNOSIS — Z1239 Encounter for other screening for malignant neoplasm of breast: Secondary | ICD-10-CM

## 2016-01-15 NOTE — Patient Instructions (Addendum)
Explained breast self awareness to [employer]. Patient did not need a Pap smear today due to last Pap smear was 12/25/2015 per patient. Patient stated that she was told to have a follow up Pap smear in one year. Told patient she can come back to [facility] to have completed or go the [facility]. Let patient know that if her next Pap smear is abnormal and she needs additional follow up that [facility] covers the follow up. Informed patient that if has Pap smear completed at the [facility] we will need a copy of her records. Referred patient to [facility] for Surgical Consult of the left breast mass per recommendation. Appointment is Thursday, February 04, 2016 at [place]. Patient aware of appointment and will be there. Ruth Chavez verbalized understanding.  Marilee Ditommaso, Arvil Chaco, RN 11:06 AM

## 2016-01-15 NOTE — Progress Notes (Signed)
Patient referred to [facility] by the [facility] due to recommending further evaluation of a left breast mass that more than doubled in size since January 2015.  Pap Smear:  Pap smear not completed today. Last Pap smear was 12/25/2015 at the [facility] and [facility]. Patient stated that she was told to have a follow up Pap smear in one year. Per patient her most recent Pap smear is the only abnormal Pap smear she has had. No Pap smear results are in EPIC.  Physical exam: Breasts Left breast larger than right breast. Per patient she has noticed the left breast increase in size over the past year. The left breast has a swollen appearance. No skin abnormalities bilateral breasts. No nipple retraction bilateral breasts. No nipple discharge bilateral breasts. No lymphadenopathy. No lumps palpated right breast. Palpated a left breast mass within the outer quadrant. Complaints of tenderness when palpated lump. Referred patient to [facility] for Surgical Consult of the left breast mass per recommendation. Appointment is Thursday, February 04, 2016 at [place].        Pelvic/Bimanual No Pap smear completed today since last Pap smear was 12/25/2015. Pap smear not indicated per [facility] guidelines.   Smoking History: Patient has never smoked.  Patient Navigation: Patient education provided. Access to services provided for patient through [facility] program.

## 2016-01-19 ENCOUNTER — Encounter (HOSPITAL_COMMUNITY): Payer: Self-pay | Admitting: *Deleted

## 2016-01-29 DIAGNOSIS — N6320 Unspecified lump in the left breast, unspecified quadrant: Secondary | ICD-10-CM

## 2016-01-29 HISTORY — DX: Unspecified lump in the left breast, unspecified quadrant: N63.20

## 2016-02-04 ENCOUNTER — Other Ambulatory Visit: Payer: Self-pay | Admitting: General Surgery

## 2016-02-04 ENCOUNTER — Encounter (HOSPITAL_BASED_OUTPATIENT_CLINIC_OR_DEPARTMENT_OTHER): Payer: Self-pay | Admitting: *Deleted

## 2016-02-04 NOTE — Pre-Procedure Instructions (Signed)
8 oz. Boost Breeze given to pt. with instructions to drink by 0815 DOS.

## 2016-02-22 ENCOUNTER — Telehealth (HOSPITAL_COMMUNITY): Payer: Self-pay | Admitting: *Deleted

## 2016-02-22 NOTE — Telephone Encounter (Signed)
Patient called and left voicemail with questions about if [facility] will cover her breast surgery. Attempted to call patient back. Left voicemail for patient to call me back.

## 2016-02-22 NOTE — Telephone Encounter (Signed)
Patient returned my phone call. Explained to patient that [facility] will cover the surgeon fee's for the breast surgery. Explained to patient that she will need to complete financial assistance paperwork for the hospital part of the surgery. Gave patient the phone number and how to find the information on the web site. Let patient know if the surgery does show cancer then she would be eligible for Medicaid to cover the expenses. Told patient if she has any additional questions or if receives a bill that should be covered by [facility] to contact me. Let her know that I would need a copy of the bill. Patient verbalized understanding.

## 2016-02-22 NOTE — Progress Notes (Signed)
Called Bethena Roys interpreter will be [place] from 11:45 until 1400 on day of surgery.

## 2016-02-23 ENCOUNTER — Encounter (HOSPITAL_BASED_OUTPATIENT_CLINIC_OR_DEPARTMENT_OTHER)
Admission: RE | Disposition: A | Discharge status: Home / Self Care | Payer: Self-pay | Source: Ambulatory Visit | Attending: General Surgery

## 2016-02-23 ENCOUNTER — Ambulatory Visit (HOSPITAL_BASED_OUTPATIENT_CLINIC_OR_DEPARTMENT_OTHER): Payer: Self-pay | Admitting: Anesthesiology

## 2016-02-23 ENCOUNTER — Encounter (HOSPITAL_BASED_OUTPATIENT_CLINIC_OR_DEPARTMENT_OTHER): Payer: Self-pay | Admitting: Anesthesiology

## 2016-02-23 ENCOUNTER — Ambulatory Visit (HOSPITAL_BASED_OUTPATIENT_CLINIC_OR_DEPARTMENT_OTHER)
Admission: RE | Admit: 2016-02-23 | Discharge: 2016-02-23 | Disposition: A | Discharge status: Home / Self Care | Payer: Self-pay | Source: Ambulatory Visit | Attending: General Surgery | Admitting: General Surgery

## 2016-02-23 DIAGNOSIS — D242 Benign neoplasm of left breast: Secondary | ICD-10-CM | POA: Insufficient documentation

## 2016-02-23 HISTORY — PX: EXCISION OF BREAST BIOPSY: SHX5822

## 2016-02-23 HISTORY — DX: Unspecified lump in the left breast, unspecified quadrant: N63.20

## 2016-02-23 SURGERY — EXCISION OF BREAST BIOPSY
Anesthesia: General | Site: Breast | Laterality: Left

## 2016-02-23 MED ORDER — FENTANYL CITRATE (PF) 100 MCG/2ML IJ SOLN
INTRAMUSCULAR | Status: AC
  Filled 2016-02-23: qty 2

## 2016-02-23 MED ORDER — DEXAMETHASONE SODIUM PHOSPHATE 4 MG/ML IJ SOLN
INTRAMUSCULAR | Status: DC | PRN
  Administered 2016-02-23: 10 mg via INTRAVENOUS

## 2016-02-23 MED ORDER — CEFAZOLIN SODIUM-DEXTROSE 2-4 GM/100ML-% IV SOLN
2.0000 g | INTRAVENOUS | Status: AC
  Administered 2016-02-23: 2 g via INTRAVENOUS

## 2016-02-23 MED ORDER — ACETAMINOPHEN 500 MG PO TABS
1000.0000 mg | ORAL_TABLET | ORAL | Status: AC
  Administered 2016-02-23: 1000 mg via ORAL

## 2016-02-23 MED ORDER — EPHEDRINE 5 MG/ML INJ
INTRAVENOUS | Status: AC
  Filled 2016-02-23: qty 10

## 2016-02-23 MED ORDER — CELECOXIB 200 MG PO CAPS
ORAL_CAPSULE | ORAL | Status: AC
  Filled 2016-02-23: qty 1

## 2016-02-23 MED ORDER — LACTATED RINGERS IV SOLN
INTRAVENOUS | Status: DC
  Administered 2016-02-23 (×2): via INTRAVENOUS

## 2016-02-23 MED ORDER — CELECOXIB 200 MG PO CAPS
200.0000 mg | ORAL_CAPSULE | ORAL | Status: AC
  Administered 2016-02-23: 200 mg via ORAL

## 2016-02-23 MED ORDER — EPHEDRINE SULFATE-NACL 50-0.9 MG/10ML-% IV SOSY
PREFILLED_SYRINGE | INTRAVENOUS | Status: DC | PRN
  Administered 2016-02-23 (×2): 10 mg via INTRAVENOUS

## 2016-02-23 MED ORDER — OXYCODONE-ACETAMINOPHEN 10-325 MG PO TABS
1.0000 | ORAL_TABLET | Freq: Four times a day (QID) | ORAL | 0 refills | Status: AC | PRN
Start: 2016-02-23 — End: 2017-02-22

## 2016-02-23 MED ORDER — GABAPENTIN 300 MG PO CAPS
ORAL_CAPSULE | ORAL | Status: AC
  Filled 2016-02-23: qty 1

## 2016-02-23 MED ORDER — GABAPENTIN 300 MG PO CAPS
300.0000 mg | ORAL_CAPSULE | ORAL | Status: AC
Start: 2016-02-23 — End: 2016-02-23
  Administered 2016-02-23: 300 mg via ORAL

## 2016-02-23 MED ORDER — ONDANSETRON HCL 4 MG/2ML IJ SOLN: 4.0000 mg | Freq: Once | INTRAMUSCULAR | Status: DC | PRN

## 2016-02-23 MED ORDER — ONDANSETRON HCL 4 MG/2ML IJ SOLN
INTRAMUSCULAR | Status: DC | PRN
  Administered 2016-02-23: 4 mg via INTRAVENOUS

## 2016-02-23 MED ORDER — OXYCODONE HCL 5 MG PO TABS
ORAL_TABLET | ORAL | Status: AC
  Filled 2016-02-23: qty 1

## 2016-02-23 MED ORDER — LIDOCAINE 2% (20 MG/ML) 5 ML SYRINGE
INTRAMUSCULAR | Status: AC
  Filled 2016-02-23: qty 10

## 2016-02-23 MED ORDER — DEXAMETHASONE SODIUM PHOSPHATE 10 MG/ML IJ SOLN
INTRAMUSCULAR | Status: AC
  Filled 2016-02-23: qty 1

## 2016-02-23 MED ORDER — ONDANSETRON HCL 4 MG/2ML IJ SOLN
INTRAMUSCULAR | Status: AC
  Filled 2016-02-23: qty 2

## 2016-02-23 MED ORDER — MIDAZOLAM HCL 2 MG/2ML IJ SOLN
1.0000 mg | INTRAMUSCULAR | Status: DC | PRN
  Administered 2016-02-23: 2 mg via INTRAVENOUS

## 2016-02-23 MED ORDER — MIDAZOLAM HCL 2 MG/2ML IJ SOLN
INTRAMUSCULAR | Status: AC
  Filled 2016-02-23: qty 2

## 2016-02-23 MED ORDER — SCOPOLAMINE 1 MG/3DAYS TD PT72: 1.0000 | MEDICATED_PATCH | Freq: Once | TRANSDERMAL | Status: DC | PRN

## 2016-02-23 MED ORDER — FENTANYL CITRATE (PF) 100 MCG/2ML IJ SOLN
50.0000 ug | INTRAMUSCULAR | Status: DC | PRN
  Administered 2016-02-23: 100 ug via INTRAVENOUS
  Administered 2016-02-23: 50 ug via INTRAVENOUS

## 2016-02-23 MED ORDER — GLYCOPYRROLATE 0.2 MG/ML IJ SOLN: 0.2000 mg | Freq: Once | INTRAMUSCULAR | Status: DC | PRN

## 2016-02-23 MED ORDER — CEFAZOLIN SODIUM-DEXTROSE 2-4 GM/100ML-% IV SOLN
INTRAVENOUS | Status: AC
  Filled 2016-02-23: qty 100

## 2016-02-23 MED ORDER — FENTANYL CITRATE (PF) 100 MCG/2ML IJ SOLN
25.0000 ug | INTRAMUSCULAR | Status: DC | PRN
  Administered 2016-02-23 (×3): 25 ug via INTRAVENOUS

## 2016-02-23 MED ORDER — ACETAMINOPHEN 500 MG PO TABS
ORAL_TABLET | ORAL | Status: AC
  Filled 2016-02-23: qty 2

## 2016-02-23 MED ORDER — PROPOFOL 10 MG/ML IV BOLUS
INTRAVENOUS | Status: DC | PRN
  Administered 2016-02-23: 50 mg via INTRAVENOUS
  Administered 2016-02-23: 150 mg via INTRAVENOUS

## 2016-02-23 MED ORDER — OXYCODONE HCL 5 MG PO TABS
5.0000 mg | ORAL_TABLET | ORAL | Status: DC | PRN
  Administered 2016-02-23: 5 mg via ORAL

## 2016-02-23 MED ORDER — LIDOCAINE 2% (20 MG/ML) 5 ML SYRINGE
INTRAMUSCULAR | Status: DC | PRN
  Administered 2016-02-23: 60 mg via INTRAVENOUS

## 2016-02-23 MED ORDER — BUPIVACAINE HCL (PF) 0.25 % IJ SOLN
INTRAMUSCULAR | Status: DC | PRN
  Administered 2016-02-23: 10 mL

## 2016-02-23 MED ORDER — PROPOFOL 10 MG/ML IV BOLUS
INTRAVENOUS | Status: AC
  Filled 2016-02-23: qty 20

## 2016-02-23 SURGICAL SUPPLY — 60 items
ADH SKN CLS APL DERMABOND .7 (GAUZE/BANDAGES/DRESSINGS) ×1
BINDER BREAST LRG (GAUZE/BANDAGES/DRESSINGS) IMPLANT
BINDER BREAST MEDIUM (GAUZE/BANDAGES/DRESSINGS) ×2 IMPLANT
BINDER BREAST XLRG (GAUZE/BANDAGES/DRESSINGS) IMPLANT
BINDER BREAST XXLRG (GAUZE/BANDAGES/DRESSINGS) IMPLANT
BLADE SURG 15 STRL LF DISP TIS (BLADE) ×1 IMPLANT
BLADE SURG 15 STRL SS (BLADE) ×3
CANISTER SUCT 1200ML W/VALVE (MISCELLANEOUS) IMPLANT
CHLORAPREP W/TINT 26ML (MISCELLANEOUS) ×3 IMPLANT
CLIP TI WIDE RED SMALL 6 (CLIP) IMPLANT
CLOSURE WOUND 1/2 X4 (GAUZE/BANDAGES/DRESSINGS) ×1
COVER BACK TABLE 60X90IN (DRAPES) ×3 IMPLANT
COVER MAYO STAND STRL (DRAPES) ×3 IMPLANT
DECANTER SPIKE VIAL GLASS SM (MISCELLANEOUS) IMPLANT
DERMABOND ADVANCED (GAUZE/BANDAGES/DRESSINGS) ×2
DERMABOND ADVANCED .7 DNX12 (GAUZE/BANDAGES/DRESSINGS) IMPLANT
DEVICE DUBIN W/COMP PLATE 8390 (MISCELLANEOUS) IMPLANT
DRAPE LAPAROSCOPIC ABDOMINAL (DRAPES) ×3 IMPLANT
DRSG TEGADERM 4X4.75 (GAUZE/BANDAGES/DRESSINGS) ×1 IMPLANT
ELECT COATED BLADE 2.86 ST (ELECTRODE) ×3 IMPLANT
ELECT REM PT RETURN 9FT ADLT (ELECTROSURGICAL) ×3
ELECTRODE REM PT RTRN 9FT ADLT (ELECTROSURGICAL) ×1 IMPLANT
GLOVE BIO SURGEON STRL SZ7 (GLOVE) ×3 IMPLANT
GLOVE BIO SURGEON STRL SZ7.5 (GLOVE) ×2 IMPLANT
GLOVE BIOGEL PI IND STRL 7.0 (GLOVE) IMPLANT
GLOVE BIOGEL PI IND STRL 7.5 (GLOVE) ×1 IMPLANT
GLOVE BIOGEL PI INDICATOR 7.0 (GLOVE) ×2
GLOVE BIOGEL PI INDICATOR 7.5 (GLOVE) ×4
GOWN STRL REUS W/ TWL LRG LVL3 (GOWN DISPOSABLE) ×3 IMPLANT
GOWN STRL REUS W/ TWL XL LVL3 (GOWN DISPOSABLE) IMPLANT
GOWN STRL REUS W/TWL 2XL LVL3 (GOWN DISPOSABLE) ×2 IMPLANT
GOWN STRL REUS W/TWL LRG LVL3 (GOWN DISPOSABLE) ×6
GOWN STRL REUS W/TWL XL LVL3 (GOWN DISPOSABLE) ×3
HEMOSTAT ARISTA ABSORB 3G PWDR (MISCELLANEOUS) ×2 IMPLANT
ILLUMINATOR WAVEGUIDE N/F (MISCELLANEOUS) ×2 IMPLANT
KIT MARKER MARGIN INK (KITS) IMPLANT
NDL HYPO 25X1 1.5 SAFETY (NEEDLE) ×1 IMPLANT
NEEDLE HYPO 25X1 1.5 SAFETY (NEEDLE) ×3 IMPLANT
NS IRRIG 1000ML POUR BTL (IV SOLUTION) IMPLANT
PACK BASIN DAY SURGERY FS (CUSTOM PROCEDURE TRAY) ×3 IMPLANT
PENCIL BUTTON HOLSTER BLD 10FT (ELECTRODE) ×3 IMPLANT
SLEEVE SCD COMPRESS KNEE MED (MISCELLANEOUS) ×3 IMPLANT
SPONGE GAUZE 4X4 12PLY STER LF (GAUZE/BANDAGES/DRESSINGS) ×3 IMPLANT
SPONGE LAP 4X18 X RAY DECT (DISPOSABLE) ×3 IMPLANT
STRIP CLOSURE SKIN 1/2X4 (GAUZE/BANDAGES/DRESSINGS) ×1 IMPLANT
SUT MNCRL AB 4-0 PS2 18 (SUTURE) IMPLANT
SUT MON AB 5-0 PS2 18 (SUTURE) ×2 IMPLANT
SUT SILK 2 0 SH (SUTURE) ×3 IMPLANT
SUT VIC AB 2-0 SH 27 (SUTURE) ×6
SUT VIC AB 2-0 SH 27XBRD (SUTURE) ×1 IMPLANT
SUT VIC AB 3-0 SH 27 (SUTURE) ×3
SUT VIC AB 3-0 SH 27X BRD (SUTURE) ×1 IMPLANT
SUT VIC AB 5-0 PS2 18 (SUTURE) IMPLANT
SUT VICRYL AB 3 0 TIES (SUTURE) IMPLANT
SYR CONTROL 10ML LL (SYRINGE) ×3 IMPLANT
TOWEL OR 17X24 6PK STRL BLUE (TOWEL DISPOSABLE) ×3 IMPLANT
TOWEL OR NON WOVEN STRL DISP B (DISPOSABLE) ×3 IMPLANT
TUBE CONNECTING 20'X1/4 (TUBING) ×1
TUBE CONNECTING 20X1/4 (TUBING) ×1 IMPLANT
YANKAUER SUCT BULB TIP NO VENT (SUCTIONS) ×2 IMPLANT

## 2016-02-23 NOTE — Op Note (Signed)
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 Serita Grammes Anesthesia: Gen. Estimated blood loss: Minimal Specimens: Left breast mass marked short stitch superior, long stitch lateral, double stitch deep Complications: None Drains: None Sponge and needle count was correct at completion Disposition to recovery stable  Indications: This is a 20 year old female with a left breast mass that is increased dramatically in size since 2015. She has an ultrasound showing a largest dimension is over 10 cm. We discussed due to rapid growth as well as its appearance excision in the operating room.  Procedure: After full consent was obtained the patient was taken to the operating room. She was given antibiotics. SCDs were in place. She was then placed under general anesthesia without complication. She was prepped and draped in the standard sterile surgical fashion. Surgical timeout was then performed.  I infiltrated Marcaine around the areola as well as around the area of the mass. I then made a periareolar incision. I then used a lighted retractor system to tunnel to the lesion. This encompassed really almost the entire left half of her breast. I was able to get around this mass which appeared clinically to be a fibroadenoma. I was unable to remove it from the periareolar incision. This was marked as above and passed off the table as a specimen. I then obtained hemostasis. I did place arista in the cavity. I  Closed most of  the cavity with 2-0 Vicryl sutures. I then closed the dermis with 3-0 Vicryl and the skin with 5-0 Monocryl. Steri-Strips including placed. A breast binder was placed. She was extubated and transferred to recovery stable.

## 2016-02-23 NOTE — Anesthesia Preprocedure Evaluation (Signed)
Anesthesia Evaluation  Patient identified by MRN, date of birth, ID band Patient awake    Reviewed: Allergy & Precautions, NPO status , Patient's Chart, lab work & pertinent test results  History of Anesthesia Complications Negative for: history of anesthetic complications  Airway Mallampati: II  TM Distance: >3 FB Neck ROM: Full    Dental no notable dental hx. (+) Dental Advisory Given   Pulmonary neg pulmonary ROS,    Pulmonary exam normal breath sounds clear to auscultation       Cardiovascular negative cardio ROS Normal cardiovascular exam Rhythm:Regular Rate:Normal     Neuro/Psych negative neurological ROS  negative psych ROS   GI/Hepatic negative GI ROS, Neg liver ROS,   Endo/Other  negative endocrine ROS  Renal/GU negative Renal ROS  negative genitourinary   Musculoskeletal negative musculoskeletal ROS (+)   Abdominal   Peds negative pediatric ROS (+)  Hematology negative hematology ROS (+)   Anesthesia Other Findings   Reproductive/Obstetrics negative OB ROS                             Anesthesia Physical Anesthesia Plan  ASA: I  Anesthesia Plan: General   Post-op Pain Management:    Induction: Intravenous  Airway Management Planned: LMA  Additional Equipment:   Intra-op Plan:   Post-operative Plan: Extubation in OR  Informed Consent: I have reviewed the patients History and Physical, chart, labs and discussed the procedure including the risks, benefits and alternatives for the proposed anesthesia with the patient or authorized representative who has indicated his/her understanding and acceptance.   Dental advisory given  Plan Discussed with: CRNA  Anesthesia Plan Comments:         Anesthesia Quick Evaluation

## 2016-02-23 NOTE — Transfer of Care (Signed)
Immediate Anesthesia Transfer of Care Note  Patient: Ruth Chavez  Procedure(s) Performed: Procedure(s): LEFT BREAST MASS EXCISION (Left)  Patient Location: PACU  Anesthesia Type:General  Level of Consciousness: awake, alert , oriented and patient cooperative  Airway & Oxygen Therapy: Patient Spontanous Breathing and Patient connected to face mask oxygen  Post-op Assessment: Report given to RN and Post -op Vital signs reviewed and stable  Post vital signs: Reviewed and stable  Last Vitals:  Vitals:   02/23/16 1026  BP: 124/67  Pulse: 71  Resp: 16  Temp: 36.9 C    Last Pain:  Vitals:   02/23/16 1026  TempSrc: Oral      Patients Stated Pain Goal: 0 ([id number])  Complications: No apparent anesthesia complications

## 2016-02-23 NOTE — Anesthesia Procedure Notes (Signed)
Procedure Name: LMA Insertion Date/Time: 02/23/2016 12:17 PM Performed by: Wanita Chamberlain Pre-anesthesia Checklist: Patient identified, Emergency Drugs available, Suction available, Patient being monitored and Timeout performed Patient Re-evaluated:Patient Re-evaluated prior to inductionOxygen Delivery Method: Circle system utilized and Simple face mask Preoxygenation: Pre-oxygenation with 100% oxygen Intubation Type: IV induction Ventilation: Mask ventilation without difficulty LMA: LMA inserted LMA Size: 4.0 Number of attempts: 1

## 2016-02-23 NOTE — Anesthesia Postprocedure Evaluation (Signed)
Anesthesia Post Note  Patient: Ruth Chavez  Procedure(s) Performed: Procedure(s) (LRB): LEFT BREAST MASS EXCISION (Left)  Patient location during evaluation: PACU Anesthesia Type: General Level of consciousness: awake and alert Pain management: pain level controlled Vital Signs Assessment: post-procedure vital signs reviewed and stable Respiratory status: spontaneous breathing, nonlabored ventilation, respiratory function stable and patient connected to nasal cannula oxygen Cardiovascular status: blood pressure returned to baseline and stable Postop Assessment: no signs of nausea or vomiting Anesthetic complications: no    Last Vitals:  Vitals:   02/23/16 1400 02/23/16 1445  BP: 138/88 125/82  Pulse: 71 67  Resp: 11 16  Temp:  36.7 C    Last Pain:  Vitals:   02/23/16 1445  TempSrc: Oral  PainSc: 6                  Laylani Pudwill JENNETTE

## 2016-02-23 NOTE — Interval H&P Note (Signed)
History and Physical Interval Note:  02/23/2016 11:48 AM  Ruth Chavez  has presented today for surgery, with the diagnosis of LEFT BREAST MASS  The various methods of treatment have been discussed with the patient and family. After consideration of risks, benefits and other options for treatment, the patient has consented to  Procedure(s): LEFT BREAST MASS EXCISION (Left) as a surgical intervention .  The patient's history has been reviewed, patient examined, no change in status, stable for surgery.  I have reviewed the patient's chart and labs.  Questions were answered to the patient's satisfaction.     Rheana Casebolt

## 2016-02-23 NOTE — Discharge Instructions (Signed)
[facility] °Office Phone Number [phone] ° °POST OP INSTRUCTIONS ° °Always review your discharge instruction sheet given to you by the facility where your surgery was performed. ° °IF YOU HAVE DISABILITY OR FAMILY LEAVE FORMS, YOU MUST BRING THEM TO THE OFFICE FOR PROCESSING.  DO NOT GIVE THEM TO YOUR DOCTOR. ° °1. A prescription for pain medication may be given to you upon discharge.  Take your pain medication as prescribed, if needed.  If narcotic pain medicine is not needed, then you may take acetaminophen (Tylenol), naprosyn (Alleve) or ibuprofen (Advil) as needed. °2. Take your usually prescribed medications unless otherwise directed °3. If you need a refill on your pain medication, please contact your pharmacy.  They will contact our office to request authorization.  Prescriptions will not be filled after 5pm or on week-ends. °4. You should eat very light the first 24 hours after surgery, such as soup, crackers, pudding, etc.  Resume your normal diet the day after surgery. °5. Most patients will experience some swelling and bruising in the breast.  Ice packs and a good support bra will help.  Wear the breast binder provided or a sports bra for 72 hours day and night.  After that wear a sports bra during the day until you return to the office. Swelling and bruising can take several days to resolve.  °6. It is common to experience some constipation if taking pain medication after surgery.  Increasing fluid intake and taking a stool softener will usually help or prevent this problem from occurring.  A mild laxative (Milk of Magnesia or Miralax) should be taken according to package directions if there are no bowel movements after 48 hours. °7. Unless discharge instructions indicate otherwise, you may remove your bandages 48 hours after surgery and you may shower at that time.  You may have steri-strips (small skin tapes) in place directly over the incision.  These strips should be left on the  skin for 7-10 days and will come off on their own.  If your surgeon used skin glue on the incision, you may shower in 24 hours.  The glue will flake off over the next 2-3 weeks.  Any sutures or staples will be removed at the office during your follow-up visit. °8. ACTIVITIES:  You may resume regular daily activities (gradually increasing) beginning the next day.  Wearing a good support bra or sports bra minimizes pain and swelling.  You may have sexual intercourse when it is comfortable. °a. You may drive when you no longer are taking prescription pain medication, you can comfortably wear a seatbelt, and you can safely maneuver your car and apply brakes. °b. RETURN TO WORK:  ______________________________________________________________________________________ °9. You should see your doctor in the office for a follow-up appointment approximately two weeks after your surgery.  Your doctor’s nurse will typically make your follow-up appointment when she calls you with your pathology report.  Expect your pathology report 3-4 business days after your surgery.  You may call to check if you do not hear from us after three days. °10. OTHER INSTRUCTIONS: _______________________________________________________________________________________________ _____________________________________________________________________________________________________________________________________ °_____________________________________________________________________________________________________________________________________ °_____________________________________________________________________________________________________________________________________ ° °WHEN TO CALL DR WAKEFIELD: °1. Fever over 101.0 °2. Nausea and/or vomiting. °3. Extreme swelling or bruising. °4. Continued bleeding from incision. °5. Increased pain, redness, or drainage from the incision. ° °The clinic staff is available to answer your questions during regular  business hours.  Please don’t hesitate to call and ask to speak to one of the nurses for clinical concerns.  If   you have a medical emergency, go to the nearest emergency room or call 911.  A surgeon from [facility] is always on call at the hospital. ° °For further questions, please visit [email or web address] ° ° ° °Post Anesthesia Home Care Instructions ° °Activity: °Get plenty of rest for the remainder of the day. A responsible adult should stay with you for 24 hours following the procedure.  °For the next 24 hours, DO NOT: °-Drive a car °-Operate machinery °-Drink alcoholic beverages °-Take any medication unless instructed by your physician °-Make any legal decisions or sign important papers. ° °Meals: °Start with liquid foods such as gelatin or soup. Progress to regular foods as tolerated. Avoid greasy, spicy, heavy foods. If nausea and/or vomiting occur, drink only clear liquids until the nausea and/or vomiting subsides. Call your physician if vomiting continues. ° °Special Instructions/Symptoms: °Your throat may feel dry or sore from the anesthesia or the breathing tube placed in your throat during surgery. If this causes discomfort, gargle with warm salt water. The discomfort should disappear within 24 hours. ° °If you had a scopolamine patch placed behind your ear for the management of post- operative nausea and/or vomiting: ° °1. The medication in the patch is effective for 72 hours, after which it should be removed.  Wrap patch in a tissue and discard in the trash. Wash hands thoroughly with soap and water. °2. You may remove the patch earlier than 72 hours if you experience unpleasant side effects which may include dry mouth, dizziness or visual disturbances. °3. Avoid touching the patch. Wash your hands with soap and water after contact with the patch. °  ° °

## 2016-02-23 NOTE — H&P (Signed)
   63 yof seen in consult from Dr Franki Cabot with left breast mass. she has no other medical issues, no prior breast issues, no family history breast disease. she had 1.7 cm mass in 2015 noted on exam and [place]. this underwent biopsy that was FA. she noted this area enlarged. she has no dc. she returned for [place] again and this area in the left breast at 3 oclock 4 cm from nipple measuring 10.2x3.7x9.5 cm and abutting the mass is a 1.7x1.1x1.4 cm mass. she is referred for evaluation  Other Problems Elbert Ewings, CMA; 02/04/2016 8:48 AM) Anxiety Disorder  Past Surgical History Elbert Ewings, CMA; 02/04/2016 8:48 AM) Oral Surgery  Diagnostic Studies History Elbert Ewings, [place]; 02/04/2016 8:48 AM) Colonoscopy never Mammogram never Pap Smear 1-5 years ago  Allergies Elbert Ewings, CMA; 02/04/2016 8:49 AM) Dia Sitter  Medication History Elbert Ewings, [place]; 02/04/2016 8:49 AM) No Current Medications Medications Reconciled  Social History Elbert Ewings, CMA; 02/04/2016 8:48 AM) Alcohol use Occasional alcohol use. Caffeine use Carbonated beverages, Coffee, Tea. No drug use Tobacco use Never smoker.  Family History Elbert Ewings, [place]; 02/04/2016 8:48 AM) Arthritis Mother. Bleeding disorder Sister. Diabetes Mellitus Father. Prostate Cancer Father.  Pregnancy / Birth History Elbert Ewings, CMA; 02/04/2016 8:48 AM) Age at menarche 47 years. Contraceptive History Contraceptive implant, Depo-provera, Oral contraceptives. Gravida 0 Irregular periods Para 0  Review of Systems Elbert Ewings CMA; 02/04/2016 8:48 AM) General Not Present- Appetite Loss, Chills, Fatigue, Fever, Night Sweats, Weight Gain and Weight Loss. Skin Not Present- Change in Wart/Mole, Dryness, Hives, Jaundice, New Lesions, Non-Healing Wounds, Rash and Ulcer. HEENT Not Present- Earache, Hearing Loss, Hoarseness, Nose Bleed, Oral Ulcers, Ringing in the Ears, Seasonal Allergies, Sinus Pain, Sore Throat, Visual Disturbances, Wears  glasses/contact lenses and Yellow Eyes. Breast Present- Breast Mass and Breast Pain. Not Present- Nipple Discharge and Skin Changes. Cardiovascular Present- Palpitations. Not Present- Chest Pain, Difficulty Breathing Lying Down, Leg Cramps, Rapid Heart Rate, Shortness of Breath and Swelling of Extremities. Gastrointestinal Not Present- Abdominal Pain, Bloating, Bloody Stool, Change in Bowel Habits, Chronic diarrhea, Constipation, Difficulty Swallowing, Excessive gas, Gets full quickly at meals, Hemorrhoids, Indigestion, Nausea, Rectal Pain and Vomiting. Female Genitourinary Not Present- Frequency, Nocturia, Painful Urination, Pelvic Pain and Urgency. Musculoskeletal Present- Back Pain. Not Present- Joint Pain, Joint Stiffness, Muscle Pain, Muscle Weakness and Swelling of Extremities. Neurological Not Present- Decreased Memory, Fainting, Headaches, Numbness, Seizures, Tingling, Tremor, Trouble walking and Weakness. Psychiatric Present- Anxiety. Not Present- Bipolar, Change in Sleep Pattern, Depression, Fearful and Frequent crying. Endocrine Not Present- Cold Intolerance, Excessive Hunger, Hair Changes, Heat Intolerance, Hot flashes and New Diabetes. Hematology Not Present- Blood Thinners, Easy Bruising, Excessive bleeding, Gland problems, HIV and Persistent Infections.  Vitals Elbert Ewings CMA; 02/04/2016 8:49 AM) 02/04/2016 8:49 AM Weight: 135 lb Height: 63in Body Surface Area: 1.64 m Body Mass Index: 23.91 kg/m  Temp.: 77F(Temporal)  Pulse: 76 (Regular)  BP: 120/82 (Sitting, Left Arm, Standard)  Physical Exam Rolm Bookbinder MD; 02/04/2016 9:21 AM) General Mental Status-Alert. Orientation-Oriented X3.  Breast Nipples-No Discharge. Note: 10 cm ruoq breast mass   Assessment & Plan Rolm Bookbinder MD; 02/04/2016 9:24 AM) LEFT BREAST MASS (N63) Story: Left breast mass excision I think due to size and enlargement this should be excised. I think breast should maintain  shape and will now be same size as other side. risks discussed as well as recovery.

## 2016-02-24 ENCOUNTER — Encounter (HOSPITAL_BASED_OUTPATIENT_CLINIC_OR_DEPARTMENT_OTHER): Payer: Self-pay | Admitting: General Surgery

## 2016-03-10 ENCOUNTER — Emergency Department (HOSPITAL_COMMUNITY)
Admission: EM | Admit: 2016-03-10 | Discharge: 2016-03-11 | Disposition: A | Discharge status: Home / Self Care | Payer: No Typology Code available for payment source | Attending: Emergency Medicine | Admitting: Emergency Medicine

## 2016-03-10 ENCOUNTER — Encounter (HOSPITAL_COMMUNITY): Payer: Self-pay

## 2016-03-10 DIAGNOSIS — Y939 Activity, unspecified: Secondary | ICD-10-CM | POA: Diagnosis not present

## 2016-03-10 DIAGNOSIS — Y92410 Unspecified street and highway as the place of occurrence of the external cause: Secondary | ICD-10-CM | POA: Insufficient documentation

## 2016-03-10 DIAGNOSIS — S199XXA Unspecified injury of neck, initial encounter: Secondary | ICD-10-CM | POA: Diagnosis present

## 2016-03-10 DIAGNOSIS — S1091XA Abrasion of unspecified part of neck, initial encounter: Secondary | ICD-10-CM | POA: Insufficient documentation

## 2016-03-10 DIAGNOSIS — S60412A Abrasion of right middle finger, initial encounter: Secondary | ICD-10-CM | POA: Insufficient documentation

## 2016-03-10 DIAGNOSIS — R51 Headache: Secondary | ICD-10-CM | POA: Insufficient documentation

## 2016-03-10 DIAGNOSIS — Y999 Unspecified external cause status: Secondary | ICD-10-CM | POA: Diagnosis not present
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
## 2016-03-11 MED ORDER — IBUPROFEN 800 MG PO TABS: 800.0000 mg | ORAL_TABLET | Freq: Three times a day (TID) | ORAL | 0 refills | Status: AC

## 2016-03-11 MED ORDER — CYCLOBENZAPRINE HCL 10 MG PO TABS: 10.0000 mg | ORAL_TABLET | Freq: Two times a day (BID) | ORAL | 0 refills | Status: AC | PRN
